# Patient Record
Sex: Male | Born: 1985 | Race: Black or African American | Hispanic: No | Marital: Married
Health system: Midwestern US, Community
[De-identification: ages and names within clinical notes are randomized; demographics above are authoritative.]

---

## 2016-03-07 ENCOUNTER — Emergency Department (HOSPITAL_COMMUNITY)
Admission: EM | Admit: 2016-03-07 | Discharge: 2016-03-07 | Disposition: A | Discharge status: Home / Self Care | Payer: Self-pay | Attending: Emergency Medicine | Admitting: Emergency Medicine

## 2016-03-07 ENCOUNTER — Emergency Department (HOSPITAL_COMMUNITY): Payer: Self-pay

## 2016-03-07 ENCOUNTER — Encounter (HOSPITAL_COMMUNITY): Payer: Self-pay

## 2016-03-07 DIAGNOSIS — Y998 Other external cause status: Secondary | ICD-10-CM | POA: Insufficient documentation

## 2016-03-07 DIAGNOSIS — Y9289 Other specified places as the place of occurrence of the external cause: Secondary | ICD-10-CM | POA: Insufficient documentation

## 2016-03-07 DIAGNOSIS — IMO0002 Reserved for concepts with insufficient information to code with codable children: Secondary | ICD-10-CM

## 2016-03-07 DIAGNOSIS — S61213A Laceration without foreign body of left middle finger without damage to nail, initial encounter: Secondary | ICD-10-CM | POA: Insufficient documentation

## 2016-03-07 DIAGNOSIS — Y93G1 Activity, food preparation and clean up: Secondary | ICD-10-CM | POA: Insufficient documentation

## 2016-03-07 DIAGNOSIS — W260XXA Contact with knife, initial encounter: Secondary | ICD-10-CM | POA: Insufficient documentation

## 2016-03-07 MED ORDER — HYDROCODONE-ACETAMINOPHEN 5-325 MG PO TABS
1.0000 | ORAL_TABLET | Freq: Once | ORAL | Status: AC
  Administered 2016-03-07: 1 via ORAL
  Filled 2016-03-07: qty 1

## 2016-03-07 MED ORDER — IBUPROFEN 800 MG PO TABS: 800.0000 mg | ORAL_TABLET | Freq: Three times a day (TID) | ORAL | Status: AC

## 2016-03-07 NOTE — Discharge Instructions (Signed)
He may take your medications as prescribed as needed for pain. I recommend eating prior to taking ibuprofen to prevent gastrointestinal side effects. Keep wound clean using antibacterial soap and water and pat dry daily. He may also apply ice to affected area for 15-20 minutes 3-4 times daily as needed for pain relief. Follow-up with one of the primary care physicians listed in the resource guide as needed if pain has not improved over the next week. Return to the emergency department if symptoms worsen or new onset of fever, redness, swelling, drainage, numbness, tingling, weakness.

## 2016-03-07 NOTE — ED Notes (Signed)
Patient here with small laceration to left hand middle finger, no bleeding

## 2016-03-07 NOTE — ED Notes (Signed)
Declined W/C at D/C and was escorted to lobby by RN. 

## 2016-03-07 NOTE — ED Provider Notes (Signed)
CSN: [id number]     Arrival date & time 03/07/16  [id number] History  By signing my name below, I, Christian Richardson, attest that this documentation has been prepared under the direction and in the presence of Christian Richardson, [place]. Electronically Signed: Ronney LionSuzanne Richardson, ED Scribe. 03/07/2016. 10:58 AM.   Chief Complaint  Patient presents with  . Extremity Laceration   The history is provided by the patient. No language interpreter was used.    HPI Comments: Christian Richardson is a 30 y.o. male who presents to the Emergency Department complaining of a laceration to his left middle finger with sudden-onset, constant, severe associated pain through the top half of his finger, that onset at 10 PM, about 12 hours ago, when patient accidentally cut himself with a butcher knife. He reports he was cutting cabbage and applying a large amount of pressure when the knife slipped, and he struck his finger. He reports initial bleeding but states he was able to control it PTA with pressure. He states he did not strike his finger on anything. Patient reports he had taken 5 x Tylenol pills last night for pain and had cleaned the wound thoroughly last night with rubbing alcohol. Pt reports tetanus UTD. He denies being on any anticoagulation.  Denies numbness, tingling, weakness.  History reviewed. No pertinent past medical history. History reviewed. No pertinent past surgical history. No family history on file. Social History  Substance Use Topics  . Smoking status: Never Smoker   . Smokeless tobacco: None  . Alcohol Use: None    Review of Systems  Skin: Positive for wound.  Hematological: Does not bruise/bleed easily.      Allergies  Review of patient's allergies indicates not on file.  Home Medications   Prior to Admission medications   Medication Sig Start Date End Date Taking? Authorizing Provider  ibuprofen (ADVIL,MOTRIN) 800 MG tablet Take 1 tablet (800 mg total) by mouth 3 (three) times daily. 03/07/16   Christian SarkNicole  Elizabeth Shaela Boer, PA-C   BP 124/78 mmHg  Pulse 67  Temp(Src) 98.2 F (36.8 C)  Resp 16  Ht 5\' 9"  (1.753 m)  Wt 92.987 kg  BMI 30.26 kg/m2  SpO2 100% Physical Exam  Constitutional: He is oriented to person, place, and time. He appears well-developed and well-nourished.  HENT:  Head: Normocephalic and atraumatic.  Eyes: Conjunctivae and EOM are normal. Right eye exhibits no discharge. Left eye exhibits no discharge. No scleral icterus.  Pulmonary/Chest: Effort normal.  Musculoskeletal:  Well-healing, 0.5 cm laceration noted to lateral aspect of left third distal phalanx, with small amount of dried blood present. Tenderness to palpation over left third DIP joint. No swelling, redness, or warmth. Decreased active ROM of left third PIP and DIP due to pain; full passive ROM. Cap refill <2 seconds. 2+ radial pulses. Grip strength equal bilaterally. Sensation grossly intact.   Neurological: He is alert and oriented to person, place, and time.  Nursing note and vitals reviewed.   ED Course  Procedures (including critical care time)  DIAGNOSTIC STUDIES: Oxygen Saturation is 100% on RA, normal by my interpretation.    COORDINATION OF CARE: 9:28 AM - Discussed treatment plan with pt at bedside which includes left hand XR to r/o bony involvement. Pt verbalized understanding and agreed to plan.   10:55 AM - Pt updated on negative XR results. Discussed wound care with pt. Strict return precautions given, and pt instructed to return immediately in cases of developing fever, warmth, swelling, and/or drainage. Will discharge with  ibuprofen 800. Pt verbalized understanding and agreed to plan.    Imaging Review Dg Finger Middle Left  03/07/2016  CLINICAL DATA:  Left long finger laceration from a knife last night. Initial encounter. EXAM: LEFT MIDDLE FINGER 2+V COMPARISON:  None. FINDINGS: No fracture dislocation is seen. Joint space widths are preserved. Bone mineralization is normal. No soft tissue  abnormality or radiopaque foreign body is seen. IMPRESSION: Negative. Electronically Signed   By: Christian Richardson M.D.   On: 03/07/2016 10:26   I have personally reviewed and evaluated these images and lab results as part of my medical decision-making.  MDM   Final diagnoses:  Laceration   Patient presents with laceration to left middle finger that occurred last night. Tdap UTD. Small well-healing laceration noted to lateral aspect of left third distal phalanx. No redness, swelling, warmth, drainage or bleeding noted. Right hand neurovascularly intact. Laceration occurred >8 hours prior arrival so it is not amenable to repair. Pt has no co morbidities to effect normal wound healing. Discussed wound home care w pt and answered questions. Will discharge with ibuprofen 800 mg to use prn for pain control and discussed wound care and symptomatic treatment. Strict return precautions given, and pt instructed to return immediately in cases of developing fever, warmth, swelling, and/or drainage. Pt is hemodynamically stable w no complaints prior to dc.     I personally performed the services described in this documentation, which was scribed in my presence. The recorded information has been reviewed and is accurate.     Christian Richardson, [place] 03/07/16 1102  Christian Kaplan, MD 03/08/16 1541

## 2017-04-09 IMAGING — DX DG FINGER MIDDLE 2+V*L*
3 series · 3 of 3 positions shown · non-contrast
Comparison: None.

CLINICAL DATA: Left long finger laceration from a knife last night.
Initial encounter.

EXAM:
LEFT MIDDLE FINGER 2+V

[finger ap]
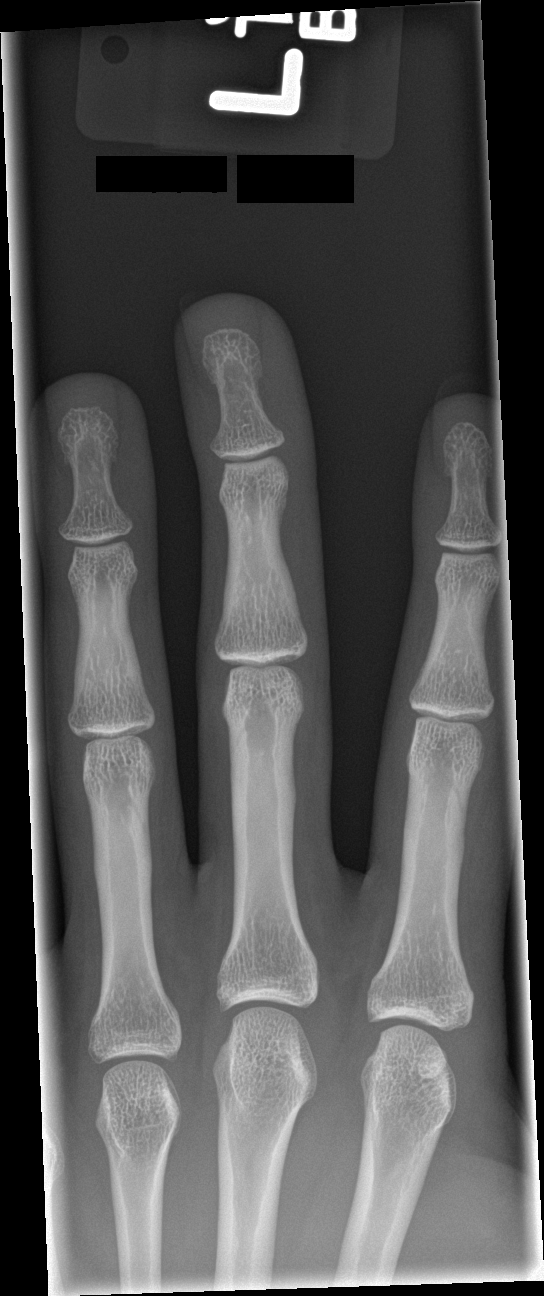

[finger obl]
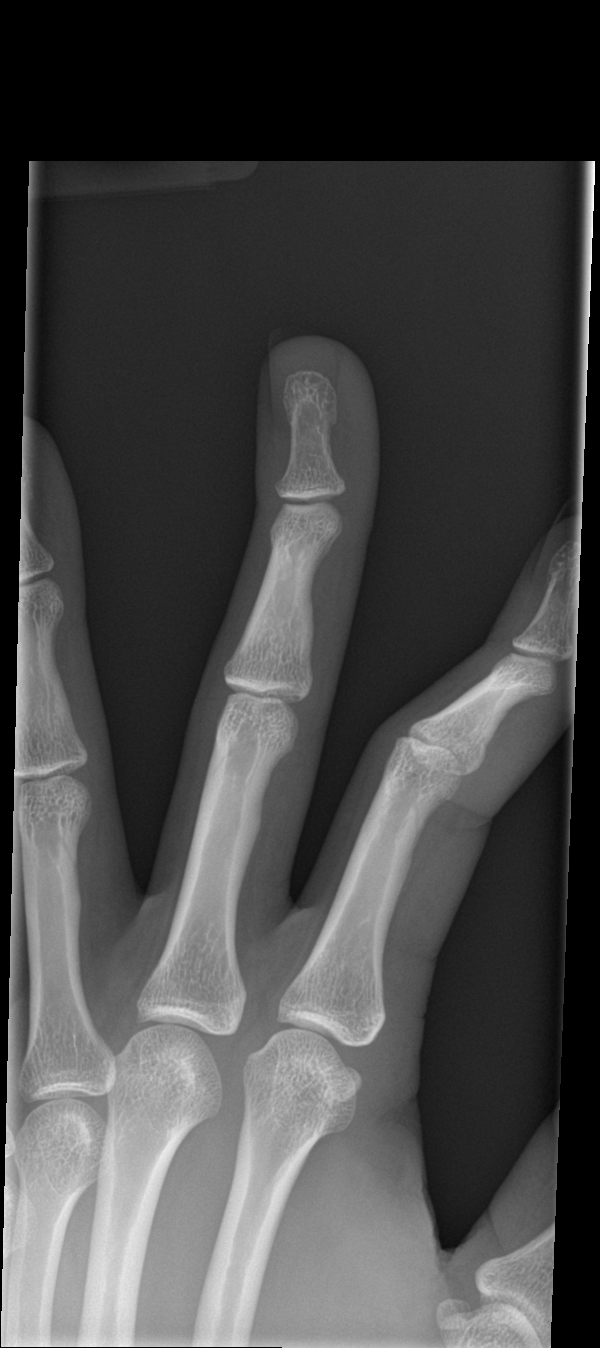

[finger lat]
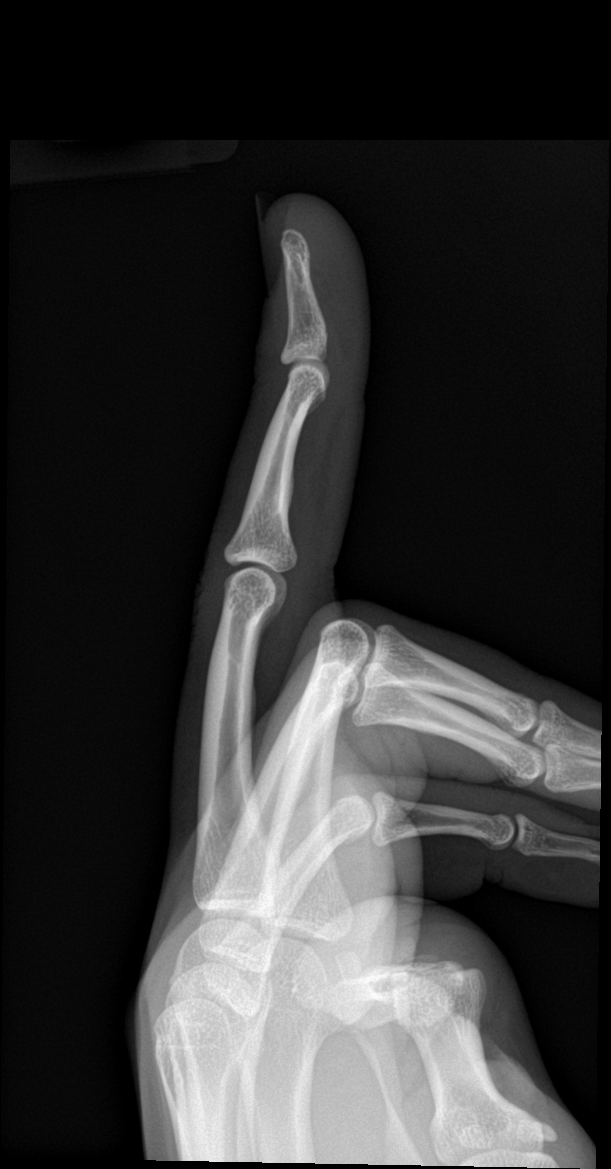

[3 of 3 positions shown; findings below may reference images not displayed]

FINDINGS: No fracture dislocation is seen. Joint space widths are preserved.
Bone mineralization is normal. No soft tissue abnormality or
radiopaque foreign body is seen.
IMPRESSION: Negative.

## 2021-07-24 ENCOUNTER — Inpatient Hospital Stay
Admit: 2021-07-24 | Discharge: 2021-07-24 | Disposition: A | Discharge status: Home / Self Care | Payer: BLUE CROSS/BLUE SHIELD | Attending: Emergency Medicine

## 2021-07-24 DIAGNOSIS — H5711 Ocular pain, right eye: Secondary | ICD-10-CM

## 2021-07-24 MED ORDER — ERYTHROMYCIN 5 MG/G EYE OINTMENT
5 mg/gram (0. %) | OPHTHALMIC | 0 refills | Status: AC
Start: 2021-07-24 — End: 2021-07-31

## 2021-07-24 NOTE — ED Notes (Signed)
Discharged home to self.  Ambulatory out of ED.  VS WDL.  0 s/s acute distress.  Respirations even and unlabored.  Discharge instructions and follow up care reviewed.  Patient receptive and demonstrated knowledge of instruction via teach-back method.

## 2021-07-24 NOTE — ED Provider Notes (Signed)
ED Provider Notes by Norva Karvonen, MD at 07/24/21 [phone]                Author: Matraca Hunkins, Belia Heman, MD  Service: EMERGENCY  Author Type: Physician       Filed: 07/24/21 0520  Date of Service: 07/24/21 0504  Status: Signed          Editor: Cellie Dardis, Belia Heman, MD (Physician)               EMERGENCY DEPARTMENT HISTORY AND PHYSICAL EXAM   ?      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   Wrist Pain: Patient reports right wrist pain x 2 months.  + decrease movement to wrist.         History Provided By: Patient      HPI: Luis Stanton, 35 y.o. male with no significant past medical history presents to the ED with cc of right eye socket irritation for 1 week.  He has a prosthetic eye insert.  There is mucus like drainage at times.   Irritation is mild.     In addition he complains of pain to the dorsum of the right wrist for 2 months.  Pain is exacerbated by extreme palmar flexion of the wrist.  He denies injury.  The area is severely painful to touch. Pain is a soreness.        There are no other complaints, changes, or physical findings at this time.      PCP: No primary care provider on file.      No current facility-administered medications on file prior to encounter.   No current outpatient medications on file prior to encounter.         Past History      Past Medical History:   History reviewed. No pertinent past medical history.      Past Surgical History:   History reviewed. No pertinent surgical history.      Family History:   History reviewed.  No pertinent family history.         Social History:   Social History     Tobacco Use       Smoking status: Some Days         Types: Cigarettes       Smokeless tobacco: Never     Vaping Use       Vaping Use: Never used     Alcohol use: Not Currently     Drug use: Never         Allergies:   No Known  Allergies         Review of Systems   @ROSBYAGE @      Physical Exam   @PHYEXAMBYAGE @      Diagnostic Study Results      Labs -    No results found for this or any previous visit (from the past 12 hour(s)).      Radiologic Studies -    No orders to display   CT Results  (Last 48 hours)     None        CXR Results  (Last 48 hours)     None  Medical Decision Making   I am the first provider for this patient.      I reviewed the vital signs, available nursing notes, past medical history, past surgical history, family history and social history.      Vital Signs-Reviewed the patient's vital signs.   Empty flowsheet group.         Records Reviewed: Nursing Notes      Provider Notes (Medical Decision Making):    There is no obvious lesion of the eye socket.        ED Course:    Initial assessment performed. The patients presenting problems have been discussed, and they are in agreement with the care plan formulated and outlined with them.  I have encouraged them to ask questions as they arise throughout their visit.                PLAN:   1. There are no discharge medications for this patient.      2. Follow-up Information     None       Return to ED if worse       Diagnosis      Clinical Impression: eye pain, ganglion cyst      ?              History reviewed. No pertinent past medical history.      History reviewed. No pertinent surgical history.        History reviewed. No pertinent family history.        Social History          Socioeconomic History         ?  Marital status:  Not on file              Spouse name:  Not on file         ?  Number of children:  Not on file     ?  Years of education:  Not on file     ?  Highest education level:  Not on file       Occupational History        ?  Not on file       Tobacco Use         ?  Smoking status:  Some Days              Types:  Cigarettes         ?  Smokeless tobacco:  Never       Vaping Use         ?  Vaping Use:  Never used       Substance and Sexual  Activity         ?  Alcohol use:  Not Currently     ?  Drug use:  Never     ?  Sexual activity:  Not on file        Other Topics  Concern        ?  Not on file       Social History Narrative        ?  Not on file          Social Determinants of Health          Financial Resource Strain: Not on file     Food Insecurity: Not on file     Transportation Needs: Not on file     Physical Activity: Not on file  Stress: Not on file     Social Connections: Not on file     Intimate Partner Violence: Not on file       Housing Stability: Not on file              ALLERGIES: Patient has no known allergies.      Review of Systems    Constitutional: Negative.     HENT: Negative.      Eyes:          Right eye socket discharge and pain    Musculoskeletal:          Right wrist pain    Neurological: Negative.        There were no vitals filed for this visit.           Physical Exam   Vitals and nursing note reviewed.    Constitutional:        Appearance: Normal appearance.    HENT:       Head: Normocephalic and atraumatic.    Eyes:       Comments: No swelling or drainage noted of the eye socket.    Musculoskeletal:         Hands:           Comments: Focal tenderness and swelling     Neurological:       Mental Status: He is alert.           MDM             Procedures
